# Patient Record
Sex: Female | Born: 1975 | Race: Black or African American | Hispanic: No | Marital: Single | Smoking: Former smoker
Health system: Southern US, Community
[De-identification: ages and names within clinical notes are randomized; demographics above are authoritative.]

## PROBLEM LIST (undated history)

## (undated) DIAGNOSIS — R51 Headache: Secondary | ICD-10-CM

## (undated) DIAGNOSIS — I10 Essential (primary) hypertension: Secondary | ICD-10-CM

## (undated) DIAGNOSIS — G35 Multiple sclerosis: Secondary | ICD-10-CM

## (undated) DIAGNOSIS — R519 Headache, unspecified: Secondary | ICD-10-CM

## (undated) DIAGNOSIS — K3184 Gastroparesis: Secondary | ICD-10-CM

## (undated) HISTORY — DX: Multiple sclerosis: G35

## (undated) HISTORY — DX: Headache, unspecified: R51.9

## (undated) HISTORY — DX: Headache: R51

## (undated) HISTORY — PX: LYMPHADENECTOMY: SHX15

## (undated) HISTORY — DX: Essential (primary) hypertension: I10

## (undated) HISTORY — DX: Gastroparesis: K31.84

---

## 2018-03-27 ENCOUNTER — Encounter: Payer: Self-pay | Admitting: Neurology

## 2018-03-28 ENCOUNTER — Ambulatory Visit (INDEPENDENT_AMBULATORY_CARE_PROVIDER_SITE_OTHER): Payer: BLUE CROSS/BLUE SHIELD | Admitting: Neurology

## 2018-03-28 ENCOUNTER — Encounter: Payer: Self-pay | Admitting: Neurology

## 2018-03-28 VITALS — BP 110/71 | HR 88 | Ht 67.0 in | Wt 133.0 lb

## 2018-03-28 DIAGNOSIS — G35 Multiple sclerosis: Secondary | ICD-10-CM

## 2018-03-28 NOTE — Patient Instructions (Signed)
Dimethyl Fumarate oral delayed-release capsules What is this medicine? DIMETHYL FUMARATE (dye meth il fue ma rate) helps to decrease the number of multiple sclerosis relapses in people with relapsing-remitting forms of the disease. It is not a cure. This medicine may be used for other purposes; ask your health care provider or pharmacist if you have questions. COMMON BRAND NAME(S): Tecfidera What should I tell my health care provider before I take this medicine? They need to know if you have any of these conditions: -immune system problems -infection -liver disease -an unusual or allergic reaction to dimethyl fumarate, other medicines, foods, dyes, or preservatives -pregnant or trying to get pregnant -breast-feeding How should I use this medicine? Take this medicine by mouth with a glass of water. Follow the directions on the prescription label. Swallow whole. Do not cut, crush, or chew this medicine. You can take it with or without food. If it upsets your stomach, take it with food. Take your medicine at regular intervals. Do not take your medicine more often than directed. Do not stop taking except on your doctor's advice. Talk to your pediatrician regarding the use of this medicine in children. Special care may be needed. Overdosage: If you think you have taken too much of this medicine contact a poison control center or emergency room at once. NOTE: This medicine is only for you. Do not share this medicine with others. What if I miss a dose? If you miss a dose, take it as soon as you can. If it is almost time for your next dose, take only that dose. Do not take double or extra doses. What may interact with this medicine? Interactions are not expected. This list may not describe all possible interactions. Give your health care provider a list of all the medicines, herbs, non-prescription drugs, or dietary supplements you use. Also tell them if you smoke, drink alcohol, or use illegal drugs.  Some items may interact with your medicine. What should I watch for while using this medicine? Tell your doctor or healthcare professional if your symptoms do not start to get better or of they get worse. You may need blood work done while you are taking this medicine. What side effects may I notice from receiving this medicine? Side effects that you should report to your doctor or health care professional as soon as possible: -allergic reactions like skin rash, itching or hives, swelling of the face, lips, or tongue -fever or chills, sore throat -signs and symptoms of liver injury like dark yellow or brown urine; general ill feeling or flu-like symptoms; light-colored stools; loss of appetite; nausea; right upper belly pain; unusually weak or tired; yellowing of the eyes or skin -signs and symptoms of a serious brain infection like changes in vision, confusion, weakness on one side of the body, loss of balance or coordination, loss of memory, or changes in personality Side effects that usually do not require medical attention (report to your doctor or health care professional if they continue or are bothersome): -diarrhea -facial flushing -nausea -stomach pain -upset stomach This list may not describe all possible side effects. Call your doctor for medical advice about side effects. You may report side effects to FDA at 1-800-FDA-1088. Where should I keep my medicine? Keep out of the reach of children. Store at room temperature between 15 and 30 degrees C (59 and 86 degrees F). Keep this medicine in the original container. Throw away any unused medicine after 90 days of opening the container. NOTE: This   sheet is a summary. It may not cover all possible information. If you have questions about this medicine, talk to your doctor, pharmacist, or health care provider.  2018 Elsevier/Gold Standard (2015-10-09 11:33:11)  

## 2018-03-28 NOTE — Progress Notes (Signed)
Provider:  Melvyn Novas, M D  Referring Provider: Andi Devon, MD Primary Care Physician:  Andi Devon, MD  Chief Complaint  Patient presents with  . New Patient (Initial Visit)    pt alone, rm 10. pt was diagnosed in [place] before moving here. 11/09/17  MRI and pt was found to have this. has not been started on any medications. pt moved here and is establishing care with neurologist. pt doesnt have any symptoms of the MS at this time. she went and was evaluated by the other MD for dizziness. she states since making diet changes BP is controlled and dizziness has gone away.     HPI:  Ashley Stark is a 42 y.o. female  Is seen here as a referral/ revisit  from Dr. Renae Gloss for transfer of MS care,  This african [place] female patient lived in [place] until March 2019, and was diagnosed with MS in 10-2017. 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   [name] had presented to the local emergency room with a complaint of vertigo, dizziness, lightheadedness upon sudden changes in posture.  With that evaluation a CT scan and finally an MRI without contrast of the brain was obtained.in there was a suspicion of demyelinating disease, the patient was referred to a neurologist who repeated the MRI this time with contrast and also added a cervical spine MRI.  There was an abnormal signal seen within the upper and mid cervical spinal cord describes as most likely consistent with multiple sclerosis.  There was no abnormal enhancement which means that these lesions were not acute.  This MRI was obtained on 09 November 2017, the MRI of the brainstem and brain with and without contrast was obtained on the same day and showed extensive abnormal supratentorial  white matter signal diagnostic consideration would be that of a demyelinating disorder such as MS.  The patient was started on Tecfidera but did not fill the prescription.  She is concerned about the side effects and not having discussed with a neurologist which other therapies are available to her, what their different side effect panels may be, and how this would affect family-planning and aspects of social life.    The patient also reported that she had a spinal tap the same day as the imaging studies were obtained, the CSF results returned on 10 November 2017 and showed an EEG of G of 5.2 normal is 3.4 at the most, IgG high at 1360 IgG index 1.02 CSF total protein 33 the interpretation stated that oligoclonal bands were present in the CSF but it does not name how many.  I was able to review her blood test metabolic panel immune panel CBC and platelets, TSH all of these were normal.   Patient is currently taking Nexium, amlodipine, fluticasone nasal spray, with a triptan for migraines, valacyclovir, azithromycin, benzonatate, Zyrtec, Mucinex and multiple vitamin supplements.  The patient was a social smoker but quit smoking in 2018, she drinks socially alcohol she does not use caffeine or recreational drugs.   Review of Systems: Out of a complete 14 system review, the patient complains of only the following symptoms, and all other reviewed systems are negative. See above, no numbness, balance problems,  Recovered from vertigo, lightheadedness.   Social History   Socioeconomic History  .  Marital status: Single    Spouse name: Not on file  . Number of children: Not on file  . Years of education: Not on file  . Highest education level: Not on file  Occupational History  . Not on file  Social Needs  . Financial resource strain: Not on file  . Food insecurity:    Worry: Not on file    Inability: Not on file  . Transportation needs:    Medical: Not on file    Non-medical: Not on file  Tobacco  Use  . Smoking status: Former Smoker    Last attempt to quit: 2018    Years since quitting: 1.5  . Smokeless tobacco: Never Used  . Tobacco comment: social smoker   Substance and Sexual Activity  . Alcohol use: Yes    Alcohol/week: 0.6 - 1.2 oz    Types: 1 - 2 Glasses of wine per week  . Drug use: Not Currently  . Sexual activity: Not on file  Lifestyle  . Physical activity:    Days per week: Not on file    Minutes per session: Not on file  . Stress: Not on file  Relationships  . Social connections:    Talks on phone: Not on file    Gets together: Not on file    Attends religious service: Not on file    Active member of club or organization: Not on file    Attends meetings of clubs or organizations: Not on file    Relationship status: Not on file  . Intimate partner violence:    Fear of current or ex partner: Not on file    Emotionally abused: Not on file    Physically abused: Not on file    Forced sexual activity: Not on file  Other Topics Concern  . Not on file  Social History Narrative  . Not on file    Family History  Problem Relation Age of Onset  . Hypertension Mother   . Hypertension Father   . Lupus Father   . Diabetes Father   . Prostate cancer Father   . Kidney failure Father   . Cancer Maternal Aunt   . Cancer Maternal Uncle   . Cancer Paternal Aunt   . Cancer Paternal Uncle     Past Medical History:  Diagnosis Date  . Gastroparesis    mild  . Headache   . Hypertension   . Multiple sclerosis (HCC)       Current Outpatient Medications  Medication Sig Dispense Refill  . amLODipine (NORVASC) 5 MG tablet Take 5 mg by mouth daily.    Marland Kitchen azithromycin (ZITHROMAX) 250 MG tablet Zithromax Z-Pak 250 mg tablet  TAKE 2 TABLETS (500 MG) BY ORAL ROUTE ONCE DAILY FOR 1 DAY THEN 1 TABLET (250 MG) BY ORAL ROUTE ONCE DAILY FOR 4 DAYS    . B Complex-Biotin-FA (B COMPLETE PO) B Complete  Take 1 capsule daily    . benzonatate (TESSALON) 100 MG capsule  benzonatate 100 mg capsule  Take 2 capsules 3 times a day by oral route as needed.    . cetirizine (ZYRTEC) 10 MG tablet Take 10 mg by mouth daily.    Marland Kitchen esomeprazole (NEXIUM) 20 MG capsule Take 20 mg by mouth daily at 12 noon.    . fluticasone (FLONASE) 50 MCG/ACT nasal spray Place 1-2 sprays into both nostrils daily.    . meclizine (ANTIVERT) 25 MG tablet Take 25 mg by mouth 3 (three) times daily  as needed for dizziness.    . Omega-3 Fatty Acids (ULTRA OMEGA 3 PO) 1,200 mg 2 (two) times daily.    . rizatriptan (MAXALT) 10 MG tablet rizatriptan 10 mg tablet  Take 1 table po prn    . valACYclovir (VALTREX) 1000 MG tablet Take 1,000 mg by mouth 2 (two) times daily as needed.    Marland Kitchen VITAMIN D, ERGOCALCIFEROL, PO Take 5,000 Units by mouth daily.     No current facility-administered medications for this visit.     Allergies as of 03/28/2018 - Review Complete 03/28/2018  Allergen Reaction Noted  . Augmentin [amoxicillin-pot clavulanate]  03/27/2018  . Bactrim [sulfamethoxazole-trimethoprim]  03/27/2018    Vitals: BP 110/71   Pulse 88   Ht 5\' 7"  (1.702 m)   Wt 133 lb (60.3 kg)   BMI 20.83 kg/m  Last Weight:  Wt Readings from Last 1 Encounters:  03/28/18 133 lb (60.3 kg)   Last Height:   Ht Readings from Last 1 Encounters:  03/28/18 5\' 7"  (1.702 m)    Physical exam:  General: The patient is awake, alert and appears not in acute distress. The patient is well groomed. Head: Normocephalic, atraumatic. Neck is supple. Cardiovascular:  Regular rate and rhythm, without  murmurs or carotid bruit, and without distended neck veins. Respiratory: Lungs are clear to auscultation. Skin:  Without evidence of edema, or rash Trunk: normal posture.  Neurologic exam : The patient is awake and alert, oriented to place and time.  Memory subjective described as intact. There is a normal attention span & concentration ability. Speech is fluent without dysarthria, dysphonia or aphasia. Mood and affect  are appropriate.  Cranial nerves: Pupils are equal and briskly reactive to light. Funduscopic exam without evidence of pallor or edema.  Extraocular movements  in vertical and horizontal planes intact and without nystagmus.  Visual fields by finger perimetry are intact. Hearing to finger rub intact. Facial sensation intact to fine touch. Facial motor strength is symmetric and tongue and uvula move midline. Tongue protrusion into either cheek is normal. Shoulder shrug is normal.   Motor exam: Normal tone ,muscle bulk and symmetric  strength in all extremities. Sensory:  Fine touch, pinprick and vibration were tested in all extremities. Proprioception was normal. Coordination: Rapid alternating movements in the fingers/hands were normal. Finger-to-nose maneuver  normal without evidence of ataxia, dysmetria or tremor. Gait and station: Patient walks without assistive device and is able unassisted to climb up to the exam table. Strength within normal limits. Stance is stable and normal. Tandem gait is unfragmented. Romberg testing is negative  Deep tendon reflexes: in the  upper and lower extremities are symmetric and intact. Babinski maneuver response is downgoing.  Assessment:  After physical and neurologic examination, review of laboratory studies, imaging, neurophysiology testing and pre-existing records, assessment is that of : MS- newly diagnosed but manifested with multiple lesions in brain and cervical cord-  Reviewed MRI on another compute with the patient , discussed the findings together. I would not need a CSF test.     No new symptoms since diagnosis. No longer vertigo.   Plan:  Treatment plan and additional workup :  I like for Mrs. Savich to start Tecfidera, if she developes flashing she should take the medication with apple sauce.  I will repeat CBC diff, and CMET with AST, ALT. and follow q 3 month.  Start after your journey next week- and call me if you have concerns. Next visit  with PT OT and  vision testing.       Porfirio Mylar Karol Skarzynski MD 03/28/2018

## 2018-03-30 LAB — CBC WITH DIFFERENTIAL/PLATELET
Basophils Absolute: 0 10*3/uL (ref 0.0–0.2)
Basos: 1 %
EOS (ABSOLUTE): 0 10*3/uL (ref 0.0–0.4)
Eos: 0 %
Hematocrit: 38.5 % (ref 34.0–46.6)
Hemoglobin: 12.8 g/dL (ref 11.1–15.9)
IMMATURE GRANS (ABS): 0 10*3/uL (ref 0.0–0.1)
IMMATURE GRANULOCYTES: 0 %
LYMPHS: 31 %
Lymphocytes Absolute: 2 10*3/uL (ref 0.7–3.1)
MCH: 31.9 pg (ref 26.6–33.0)
MCHC: 33.2 g/dL (ref 31.5–35.7)
MCV: 96 fL (ref 79–97)
MONOS ABS: 0.4 10*3/uL (ref 0.1–0.9)
Monocytes: 6 %
NEUTROS PCT: 62 %
Neutrophils Absolute: 3.9 10*3/uL (ref 1.4–7.0)
PLATELETS: 212 10*3/uL (ref 150–450)
RBC: 4.01 x10E6/uL (ref 3.77–5.28)
RDW: 11.8 % — AB (ref 12.3–15.4)
WBC: 6.3 10*3/uL (ref 3.4–10.8)

## 2018-03-30 LAB — COMPREHENSIVE METABOLIC PANEL
A/G RATIO: 1.4 (ref 1.2–2.2)
ALK PHOS: 65 IU/L (ref 39–117)
ALT: 20 IU/L (ref 0–32)
AST: 20 IU/L (ref 0–40)
Albumin: 4.2 g/dL (ref 3.5–5.5)
BUN/Creatinine Ratio: 17 (ref 9–23)
BUN: 12 mg/dL (ref 6–24)
Bilirubin Total: 0.2 mg/dL (ref 0.0–1.2)
CALCIUM: 9.3 mg/dL (ref 8.7–10.2)
CO2: 25 mmol/L (ref 20–29)
CREATININE: 0.71 mg/dL (ref 0.57–1.00)
Chloride: 105 mmol/L (ref 96–106)
GFR calc Af Amer: 121 mL/min/{1.73_m2} (ref >59)
GFR, EST NON AFRICAN AMERICAN: 105 mL/min/{1.73_m2} (ref >59)
GLOBULIN, TOTAL: 2.9 g/dL (ref 1.5–4.5)
Glucose: 94 mg/dL (ref 65–99)
POTASSIUM: 4.3 mmol/L (ref 3.5–5.2)
SODIUM: 145 mmol/L — AB (ref 134–144)
Total Protein: 7.1 g/dL (ref 6.0–8.5)

## 2018-03-30 LAB — NEUROMYELITIS OPTICA AUTOAB, IGG

## 2018-04-02 ENCOUNTER — Telehealth: Payer: Self-pay | Admitting: Neurology

## 2018-04-02 NOTE — Telephone Encounter (Signed)
Called the patient and informed her of the lab work being normal and the antibody was negative which was good. Pt verbalized understanding. Pt had no questions at this time but was encouraged to call back if questions arise.

## 2018-04-02 NOTE — Telephone Encounter (Signed)
-----   Message from Melvyn Novas, MD sent at 04/02/2018  8:38 AM EDT ----- Negative neuromyelytis optica antibody - GOOD !!!  Cc Dr Victorio Palm, please share the CMET and CBC with her.

## 2018-04-17 ENCOUNTER — Ambulatory Visit (INDEPENDENT_AMBULATORY_CARE_PROVIDER_SITE_OTHER): Payer: BLUE CROSS/BLUE SHIELD | Admitting: Neurology

## 2018-04-17 DIAGNOSIS — G35 Multiple sclerosis: Secondary | ICD-10-CM | POA: Diagnosis not present

## 2018-04-17 NOTE — Progress Notes (Signed)
    04/17/2018  Meredeth Ide 10-01-1975  Referred by Dr. Vickey Huger  History: Ms. Harton is a 42 year old woman who was diagnosed with multiple sclerosis earlier this year noting visual changes.  Description:  The visual evoked response test was performed today using 32 x 32 check sizes. The absolute latencies for the N1 and the P100 wave forms were within normal limits bilaterally. The amplitudes for the P100 wave forms were also within normal limits bilaterally.  The P100 was 105 mS on the left and 108 mS on the right.    Impression: The visual evoked response test above was within normal limits bilaterally. No evidence of conduction slowing was seen within the anterior visual pathways on either side on today's evaluation.  Richard A. Epimenio Foot, MD, PhD, FAAN Certified in Neurology, Clinical Neurophysiology, Sleep Medicine, Pain Medicine and Neuroimaging Director, [facility] at [facility]  [facility] [address], [address] [place], [place] [address] [phone]

## 2018-04-18 ENCOUNTER — Telehealth: Payer: Self-pay | Admitting: Neurology

## 2018-04-18 NOTE — Telephone Encounter (Signed)
I called the pt. [facility] uses [facility] for [facility] and states that a PA is needed. I will attempt one for the pt

## 2018-04-18 NOTE — Telephone Encounter (Signed)
[facility] says [facility] [address] [facility] for Tecfidera.

## 2018-04-19 ENCOUNTER — Telehealth: Payer: Self-pay | Admitting: Neurology

## 2018-04-19 NOTE — Telephone Encounter (Signed)
PA submitted through cover my meds, [employer].  [id number]. Can take up to 3 business days before hearing a response.

## 2018-04-23 NOTE — Telephone Encounter (Signed)
[employer] denied as pt does not have prescription coverage through them. I called pt and made her aware of this and she was able to provide me with her RX card information [facility] ID [id number] [facility]- [id number] PCN: [id number]- [id number]  Re sent the PA through the information provided above to [facility] on cover my meds, will take 24 hours before hearing determination. [id number]

## 2018-04-25 ENCOUNTER — Encounter: Payer: Self-pay | Admitting: Neurology

## 2018-04-25 NOTE — Telephone Encounter (Signed)
Medication was denied. I am starting the appeals process for the patient. Can take up to 30 days before hearing back with the appeals process

## 2018-04-30 NOTE — Telephone Encounter (Signed)
PA approved under the pt current insurance plan, [facility],  from 03/27/18-04/27/2020 PA# [id number] A PP

## 2018-06-26 ENCOUNTER — Telehealth: Payer: Self-pay | Admitting: Neurology

## 2018-06-26 NOTE — Telephone Encounter (Signed)
Called the patient back. Apologized that I had not already called with these results. Appears that it didn't get sent to me. I reviewed Dr Bonnita Hollow note on the study that was performed and it was within normal limits. Advised the patient of this. She verbalized understanding and was appreciative for the information. Pt had no questions at this time but was encouraged to call back if questions arise.

## 2018-06-26 NOTE — Telephone Encounter (Signed)
error 

## 2018-06-26 NOTE — Telephone Encounter (Signed)
Patient calling to get VAER results

## 2018-07-03 ENCOUNTER — Other Ambulatory Visit: Payer: Self-pay | Admitting: Internal Medicine

## 2018-07-03 ENCOUNTER — Other Ambulatory Visit (HOSPITAL_COMMUNITY): Payer: Self-pay | Admitting: Internal Medicine

## 2018-07-03 ENCOUNTER — Ambulatory Visit: Payer: BLUE CROSS/BLUE SHIELD | Admitting: Neurology

## 2018-07-03 DIAGNOSIS — G35 Multiple sclerosis: Secondary | ICD-10-CM

## 2018-07-13 ENCOUNTER — Ambulatory Visit
Admission: RE | Admit: 2018-07-13 | Discharge: 2018-07-13 | Disposition: A | Discharge status: Home / Self Care | Payer: BLUE CROSS/BLUE SHIELD | Source: Ambulatory Visit | Attending: Internal Medicine | Admitting: Internal Medicine

## 2018-07-13 DIAGNOSIS — G35 Multiple sclerosis: Secondary | ICD-10-CM

## 2018-07-13 MED ORDER — GADOBENATE DIMEGLUMINE 529 MG/ML IV SOLN
12.0000 mL | Freq: Once | INTRAVENOUS | Status: AC | PRN
  Administered 2018-07-13: 12 mL via INTRAVENOUS

## 2018-07-16 ENCOUNTER — Other Ambulatory Visit: Payer: Self-pay | Admitting: Internal Medicine

## 2018-07-16 ENCOUNTER — Ambulatory Visit
Admission: RE | Admit: 2018-07-16 | Discharge: 2018-07-16 | Disposition: A | Discharge status: Home / Self Care | Payer: Self-pay | Source: Ambulatory Visit | Attending: Internal Medicine | Admitting: Internal Medicine

## 2018-07-16 DIAGNOSIS — R52 Pain, unspecified: Secondary | ICD-10-CM

## 2018-10-04 ENCOUNTER — Ambulatory Visit: Payer: BLUE CROSS/BLUE SHIELD | Admitting: Neurology

## 2018-12-20 ENCOUNTER — Ambulatory Visit: Payer: BLUE CROSS/BLUE SHIELD | Admitting: Neurology

## 2019-02-05 ENCOUNTER — Telehealth: Payer: Self-pay | Admitting: Neurology

## 2019-02-05 NOTE — Telephone Encounter (Signed)
Due to current COVID 19 pandemic, our office is severely reducing in office visits until further notice, in order to minimize the risk to our patients and healthcare providers.   I called patient regarding her 6/2 appointment. I stated that we can offer an in office visit since she has previously declined a virtual visit. Patient declined an in office visit as she feels that it would not be beneficial to her. She states she is not currently on her medication prescribed by Dr. Vickey Huger and has been trying other options with her PCP to manage her MS. I advised patient that I would cancel this appt and told her to call back if needed.

## 2019-02-12 ENCOUNTER — Ambulatory Visit: Payer: BLUE CROSS/BLUE SHIELD | Admitting: Neurology

## 2019-02-12 ENCOUNTER — Encounter

## 2019-10-08 ENCOUNTER — Other Ambulatory Visit: Payer: Self-pay | Admitting: Internal Medicine

## 2019-10-08 DIAGNOSIS — G35 Multiple sclerosis: Secondary | ICD-10-CM

## 2019-11-11 ENCOUNTER — Ambulatory Visit
Admission: RE | Admit: 2019-11-11 | Discharge: 2019-11-11 | Disposition: A | Discharge status: Home / Self Care | Payer: BC Managed Care – PPO | Source: Ambulatory Visit | Attending: Internal Medicine | Admitting: Internal Medicine

## 2019-11-11 ENCOUNTER — Other Ambulatory Visit: Payer: Self-pay

## 2019-11-11 DIAGNOSIS — G35 Multiple sclerosis: Secondary | ICD-10-CM

## 2019-11-11 MED ORDER — GADOBENATE DIMEGLUMINE 529 MG/ML IV SOLN
11.0000 mL | Freq: Once | INTRAVENOUS | Status: AC | PRN
  Administered 2019-11-11: 11 mL via INTRAVENOUS

## 2020-12-30 ENCOUNTER — Other Ambulatory Visit: Payer: Self-pay | Admitting: Internal Medicine

## 2020-12-30 DIAGNOSIS — G35 Multiple sclerosis: Secondary | ICD-10-CM

## 2021-01-28 ENCOUNTER — Other Ambulatory Visit: Payer: BC Managed Care – PPO

## 2021-03-30 ENCOUNTER — Ambulatory Visit
Admission: RE | Admit: 2021-03-30 | Discharge: 2021-03-30 | Disposition: A | Discharge status: Home / Self Care | Payer: BC Managed Care – PPO | Source: Ambulatory Visit | Attending: Internal Medicine | Admitting: Internal Medicine

## 2021-03-30 ENCOUNTER — Other Ambulatory Visit: Payer: Self-pay

## 2021-03-30 DIAGNOSIS — G35 Multiple sclerosis: Secondary | ICD-10-CM

## 2021-03-30 MED ORDER — GADOBENATE DIMEGLUMINE 529 MG/ML IV SOLN
12.0000 mL | Freq: Once | INTRAVENOUS | Status: AC | PRN
  Administered 2021-03-30: 12 mL via INTRAVENOUS

## 2021-11-29 ENCOUNTER — Other Ambulatory Visit: Payer: Self-pay | Admitting: Internal Medicine

## 2021-11-29 DIAGNOSIS — G35 Multiple sclerosis: Secondary | ICD-10-CM

## 2022-03-28 ENCOUNTER — Ambulatory Visit
Admission: RE | Admit: 2022-03-28 | Discharge: 2022-03-28 | Disposition: A | Discharge status: Home / Self Care | Payer: BC Managed Care – PPO | Source: Ambulatory Visit | Attending: Internal Medicine | Admitting: Internal Medicine

## 2022-03-28 DIAGNOSIS — G35 Multiple sclerosis: Secondary | ICD-10-CM

## 2022-03-28 MED ORDER — GADOBENATE DIMEGLUMINE 529 MG/ML IV SOLN
13.0000 mL | Freq: Once | INTRAVENOUS | Status: AC | PRN
  Administered 2022-03-28: 13 mL via INTRAVENOUS

## 2023-03-12 IMAGING — MR MR HEAD WO/W CM
14 series · 48 of 48 positions shown · IV contrast (multihance)
Comparison: 11/11/2019

CLINICAL DATA: Multiple sclerosis follow-up

EXAM:
MRI HEAD WITHOUT AND WITH CONTRAST
TECHNIQUE: Multiplanar, multiecho pulse sequences of the brain and surrounding
structures were obtained without and with intravenous contrast.
CONTRAST:  12mL MULTIHANCE GADOBENATE DIMEGLUMINE 529 MG/ML IV SOLN

[Series 2: T1 · sagittal · 5.0mm · 0.45mm/px · 2 of 25 slices shown]
[im 1/25]
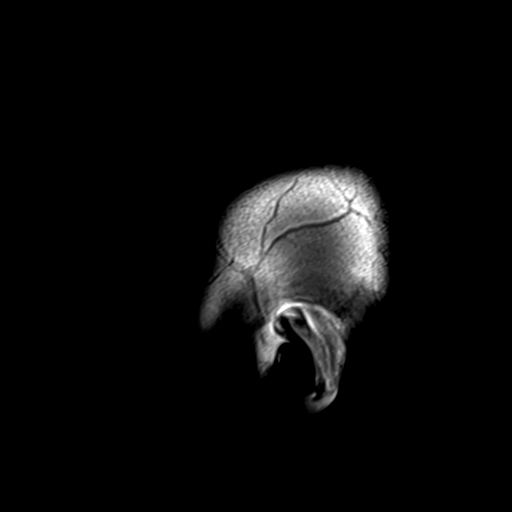
[im 25/25]
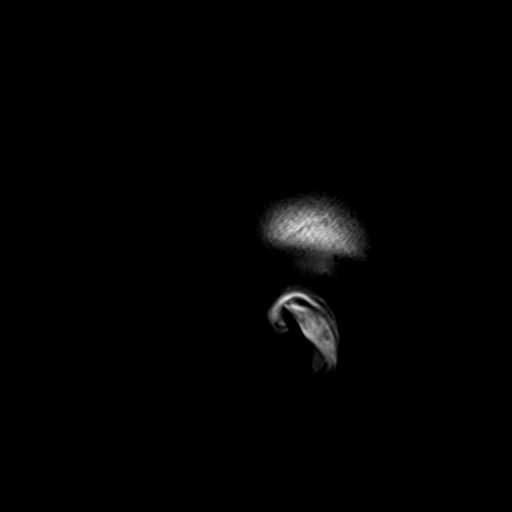

[Series 3: FLAIR · sagittal · 3.0mm · 0.43mm/px · 2 of 40 slices shown (1 of 2)]
[im 1/40]
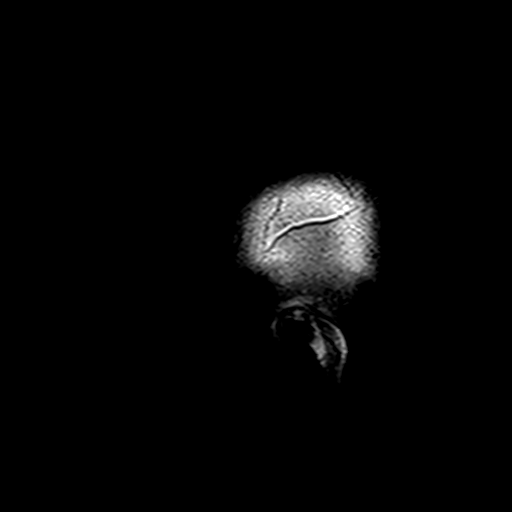
[im 40/40]
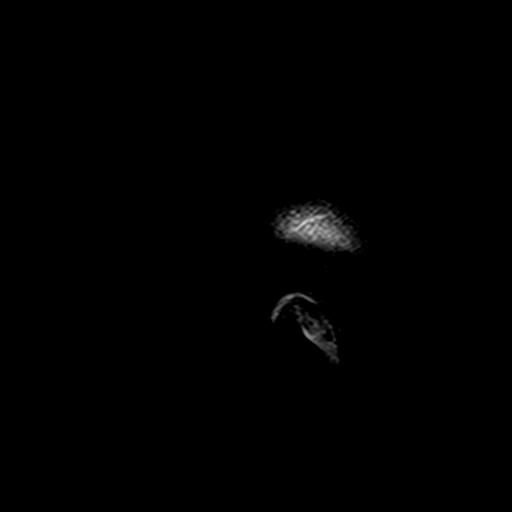

[Series 4: ax ep2d_diff_3 · axial · 3.0mm · 1.80mm/px · z∈[-58,+104]mm · 6 of 110 slices shown]
[im 1/110]
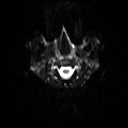
[im 22/110]
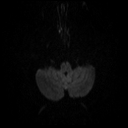
[im 44/110]
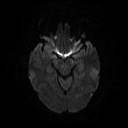
[im 66/110]
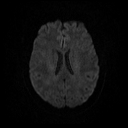
[im 88/110]
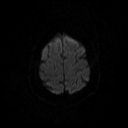
[im 110/110]
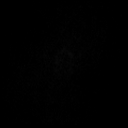

[Series 5: ax ep2d_diff_3_adc · axial · 3.0mm · 1.80mm/px · z∈[-58,+104]mm · 3 of 54 slices shown]
[im 1/54]
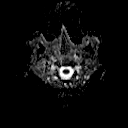
[im 27/54]
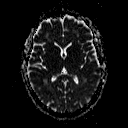
[im 54/54]
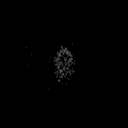

[Series 6: cor ep2d_diff · coronal · 5.0mm · 1.77mm/px · 3 of 59 slices shown]
[im 1/59]
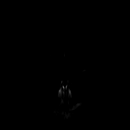
[im 30/59]
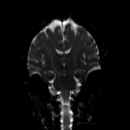
[im 59/59]
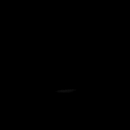

[Series 7: cor ep2d_diff_adc · coronal · 5.0mm · 1.77mm/px · 2 of 30 slices shown]
[im 1/30]
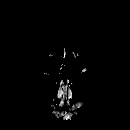
[im 30/30]
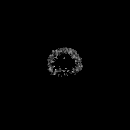

[Series 9: swi_images · axial · 2.0mm · 0.98mm/px · z∈[-55,+103]mm · 4 of 80 slices shown]
[im 1/80]
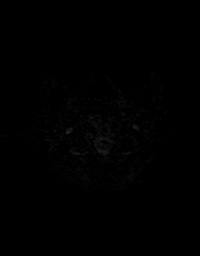
[im 27/80]
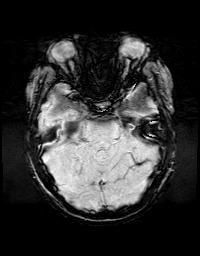
[im 53/80]
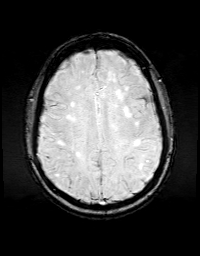
[im 80/80]
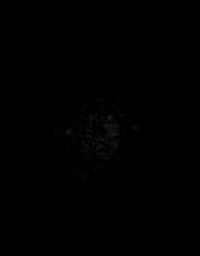

[Series 10: FLAIR · axial · 3.0mm · 0.43mm/px · z∈[-53,+99]mm · 2 of 40 slices shown (2 of 2)]
[im 1/40]
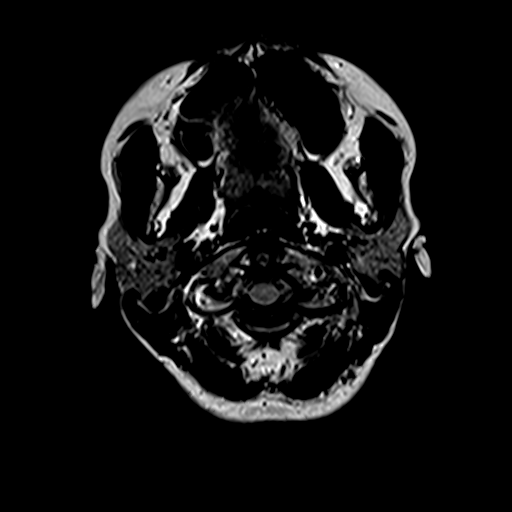
[im 40/40]
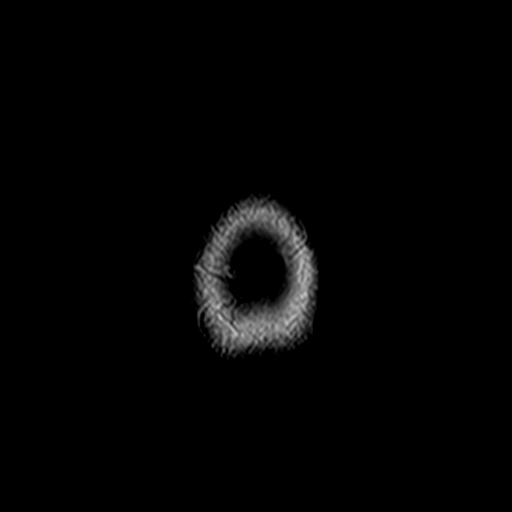

[Series 11: T2 · axial · 5.0mm · 0.65mm/px · z∈[-60,+108]mm · 2 of 29 slices shown (1 of 2)]
[im 1/29]
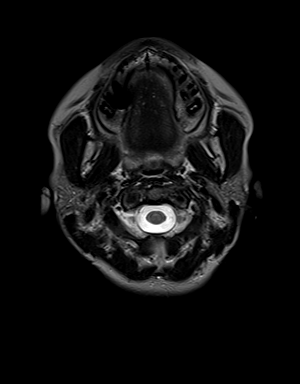
[im 29/29]
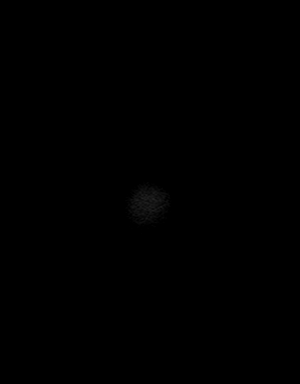

[Series 12: t1_mpr_tra · axial · 1.0mm · 0.72mm/px · z∈[-56,+103]mm · 9 of 160 slices shown]
[im 1/160]
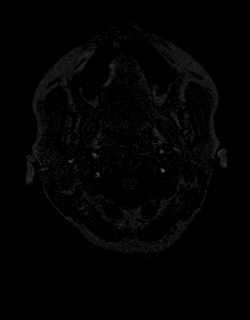
[im 20/160]
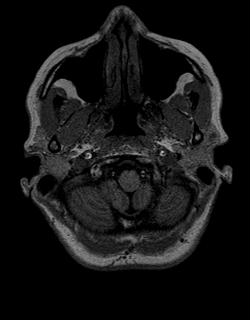
[im 40/160]
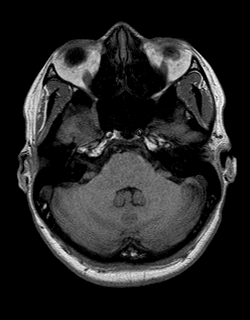
[im 60/160]
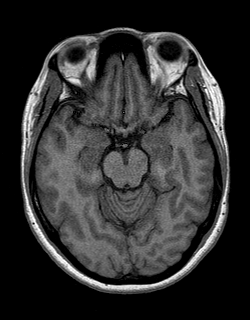
[im 80/160]
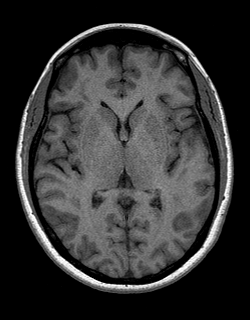
[im 100/160]
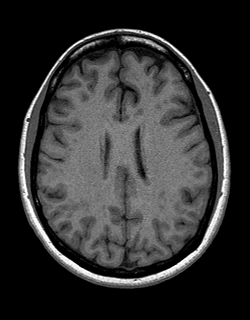
[im 120/160]
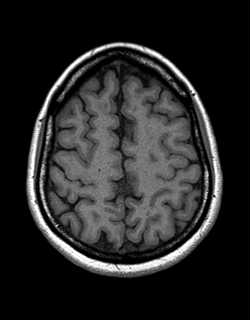
[im 140/160]
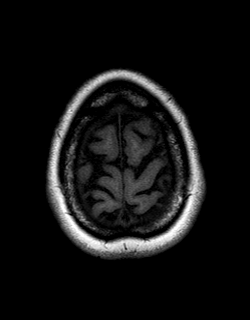
[im 160/160]
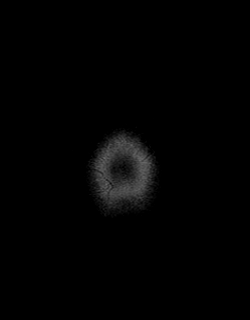

[Series 13: T2 · coronal · 5.0mm · 0.45mm/px · 2 of 31 slices shown (2 of 2)]
[im 1/31]
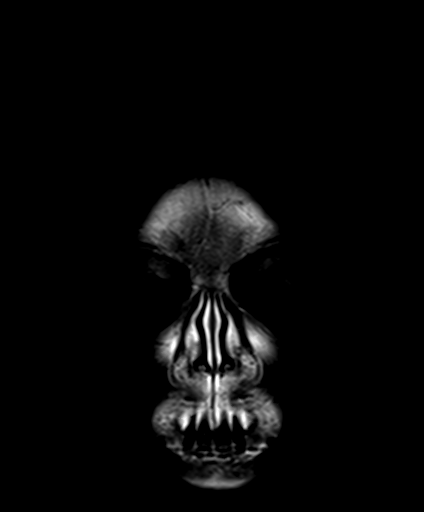
[im 31/31]
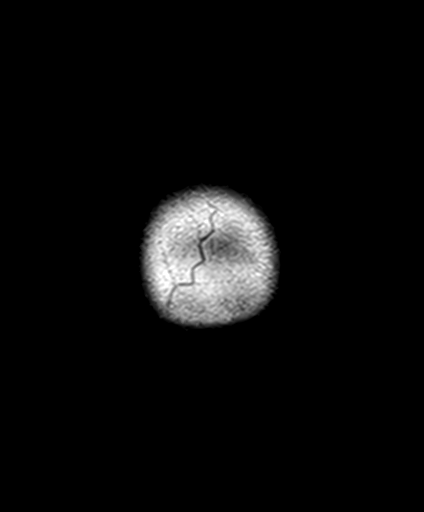

[Series 14: T1 post-contrast · coronal · 5.0mm · 0.72mm/px · 1 of 26 slices shown]
[im 1/26]
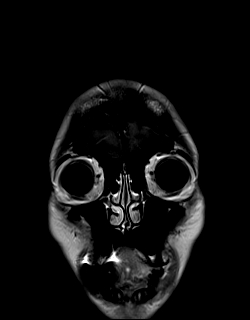

[Series 15: post t1_mpr_tra · axial · 1.0mm · 0.72mm/px · z∈[-56,+103]mm · 9 of 160 slices shown]
[im 1/160]
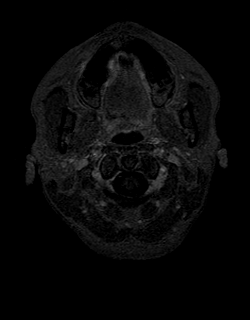
[im 20/160]
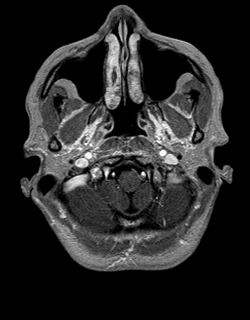
[im 40/160]
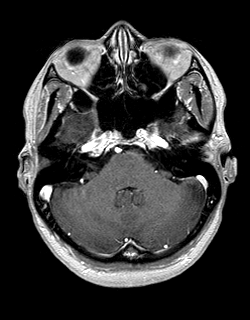
[im 60/160]
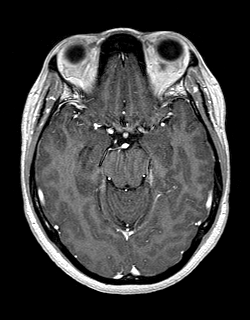
[im 80/160]
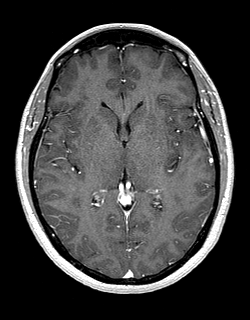
[im 100/160]
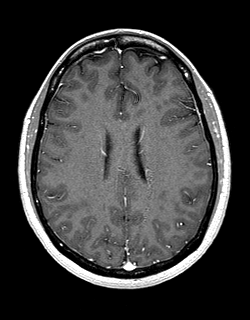
[im 120/160]
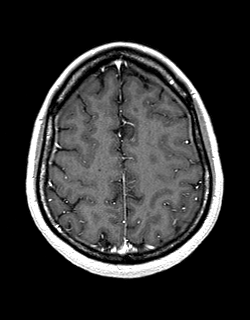
[im 140/160]
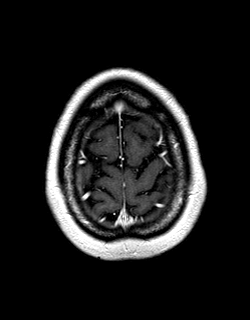
[im 160/160]
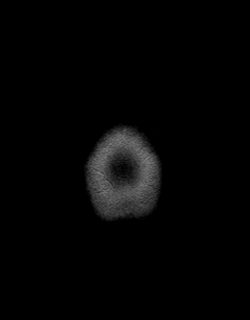

[Series 16: t1_se_sag post · sagittal · 5.0mm · 0.45mm/px · 1 of 25 slices shown]
[im 1/25]
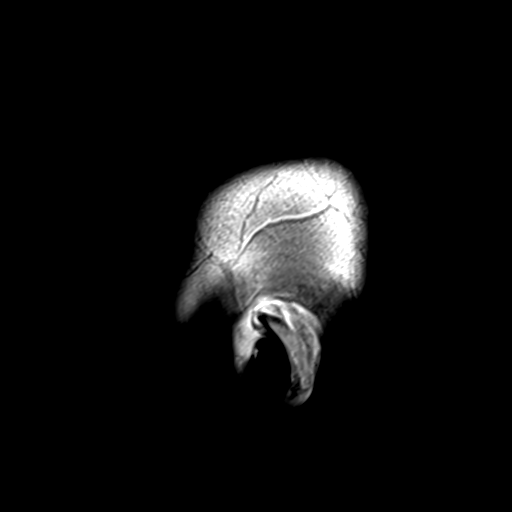

[48 of 48 positions shown; findings below may reference images not displayed]

FINDINGS: Brain: No acute infarct, mass effect or extra-axial collection. No
acute or chronic hemorrhage. Unchanged distribution of numerous
bilateral subcortical and deep white matter lesions the midline
structures are normal.

Vascular: Major flow voids are preserved.

Skull and upper cervical spine: Normal calvarium and skull base.
Visualized upper cervical spine and soft tissues are normal.

Sinuses/Orbits:No paranasal sinus fluid levels or advanced mucosal
thickening. No mastoid or middle ear effusion. Normal orbits.
IMPRESSION: Unchanged distribution of chronic demyelinating lesions. No active
demyelination

## 2023-03-29 ENCOUNTER — Other Ambulatory Visit: Payer: Self-pay | Admitting: Internal Medicine

## 2023-03-29 DIAGNOSIS — G35 Multiple sclerosis: Secondary | ICD-10-CM

## 2023-03-29 DIAGNOSIS — G35D Multiple sclerosis, unspecified: Secondary | ICD-10-CM

## 2024-03-07 ENCOUNTER — Other Ambulatory Visit: Payer: Self-pay | Admitting: Internal Medicine

## 2024-03-07 DIAGNOSIS — G35 Multiple sclerosis: Secondary | ICD-10-CM

## 2024-03-12 ENCOUNTER — Encounter: Payer: Self-pay | Admitting: Internal Medicine

## 2024-04-02 ENCOUNTER — Other Ambulatory Visit

## 2024-04-05 ENCOUNTER — Ambulatory Visit
Admission: RE | Admit: 2024-04-05 | Discharge: 2024-04-05 | Disposition: A | Discharge status: Home / Self Care | Source: Ambulatory Visit | Attending: Internal Medicine | Admitting: Internal Medicine

## 2024-04-05 DIAGNOSIS — G35 Multiple sclerosis: Secondary | ICD-10-CM

## 2024-04-05 MED ORDER — GADOPICLENOL 0.5 MMOL/ML IV SOLN
7.5000 mL | Freq: Once | INTRAVENOUS | Status: AC | PRN
  Administered 2024-04-05: 7.5 mL via INTRAVENOUS

## 2024-04-06 ENCOUNTER — Other Ambulatory Visit
